# Patient Record
Sex: Male | Born: 1945 | Race: White | Hispanic: No | Marital: Married | Smoking: Never smoker
Health system: Southern US, Community
[De-identification: ages and names within clinical notes are randomized; demographics above are authoritative.]

## PROBLEM LIST (undated history)

## (undated) DIAGNOSIS — Q602 Renal agenesis, unspecified: Secondary | ICD-10-CM

## (undated) DIAGNOSIS — N411 Chronic prostatitis: Secondary | ICD-10-CM

## (undated) DIAGNOSIS — I10 Essential (primary) hypertension: Secondary | ICD-10-CM

## (undated) HISTORY — DX: Essential (primary) hypertension: I10

## (undated) HISTORY — DX: Renal agenesis, unspecified: Q60.2

## (undated) HISTORY — DX: Chronic prostatitis: N41.1

## (undated) HISTORY — PX: OTHER SURGICAL HISTORY: SHX169

---

## 2021-08-07 ENCOUNTER — Other Ambulatory Visit: Payer: Self-pay | Admitting: Family Medicine

## 2021-08-07 DIAGNOSIS — R202 Paresthesia of skin: Secondary | ICD-10-CM

## 2021-08-08 ENCOUNTER — Ambulatory Visit
Admission: RE | Admit: 2021-08-08 | Discharge: 2021-08-08 | Disposition: A | Discharge status: Home / Self Care | Payer: Medicare Other | Source: Ambulatory Visit | Attending: Family Medicine | Admitting: Family Medicine

## 2021-08-08 DIAGNOSIS — R202 Paresthesia of skin: Secondary | ICD-10-CM

## 2021-08-08 IMAGING — MR MR MRA HEAD W/O CM
1 series · 22 of 48 positions shown · IV contrast (multihance)
Comparison: None.

CLINICAL DATA: Left arm and left leg numbness.

EXAM:
MRI HEAD WITHOUT AND WITH CONTRAST
MRA HEAD WITHOUT CONTRAST
TECHNIQUE: Multiplanar, multiecho pulse sequences of the brain and surrounding
structures were obtained without and with intravenous contrast.
Angiographic images of the Circle of Willis were obtained using MRA
technique without intravenous contrast.
CONTRAST:  14 mL of MultiHance IV.

[Series 2: tof_3d_multi-slab · axial · 0.7mm · 0.35mm/px · z∈[-74,+21]mm · 22 of 143 slices shown]
[im 1/143]
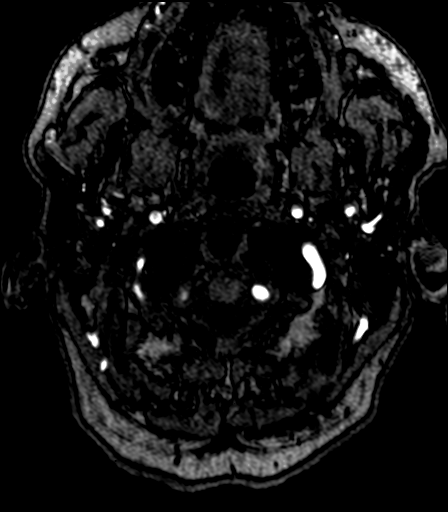
[im 4/143]
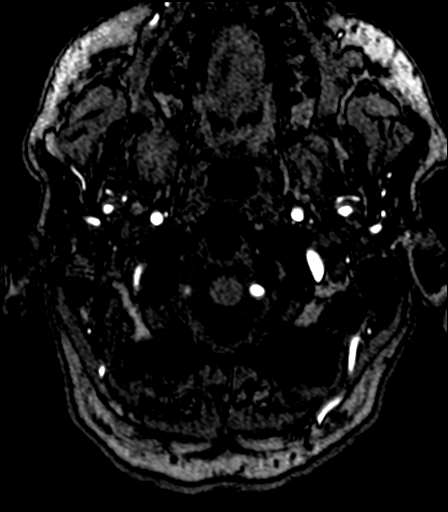
[im 7/143]
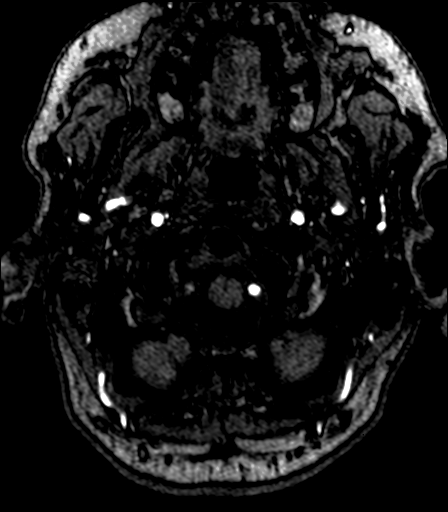
[im 10/143]
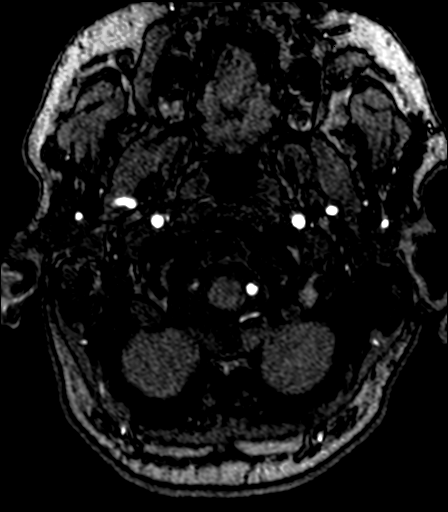
[im 13/143]
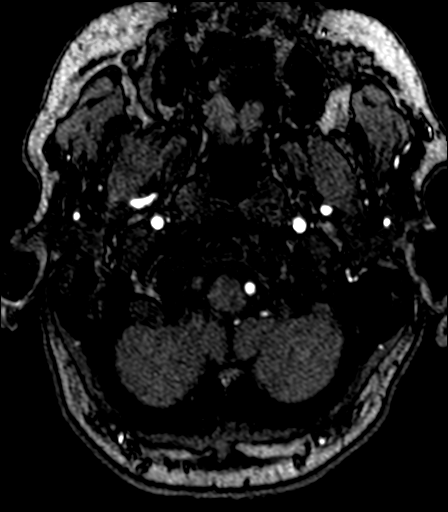
[im 16/143]
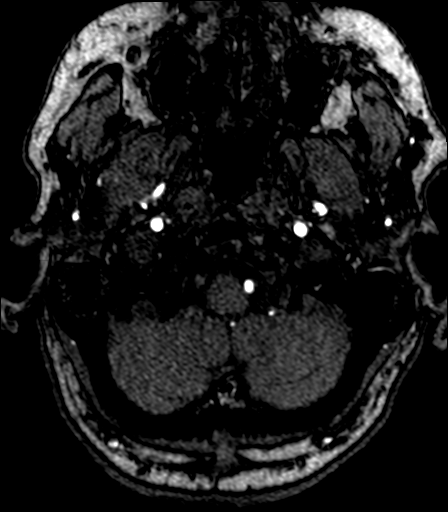
[im 19/143]
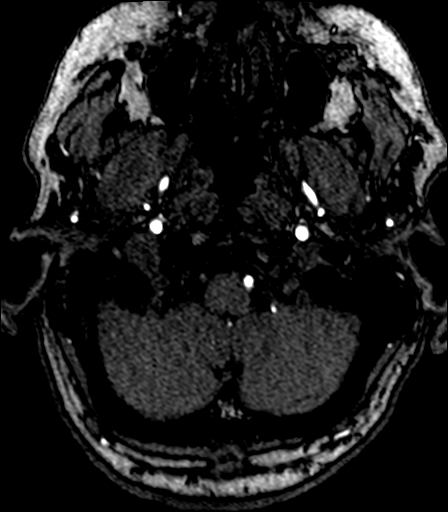
[im 22/143]
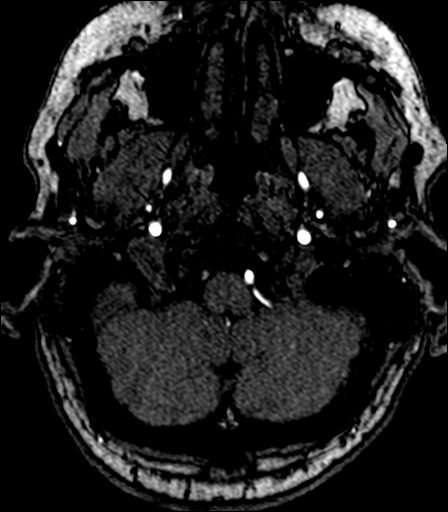
[im 25/143]
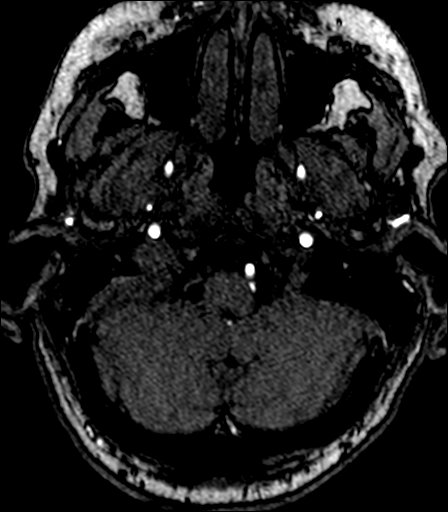
[im 28/143]
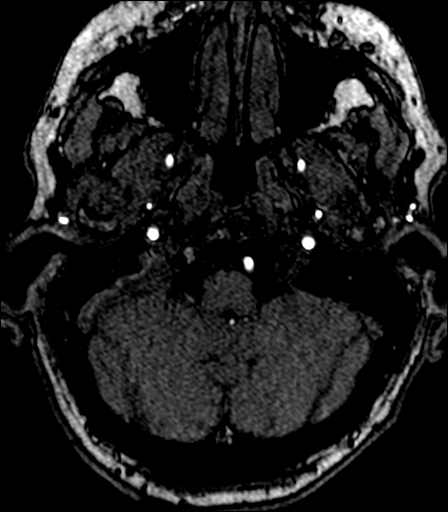
[im 31/143]
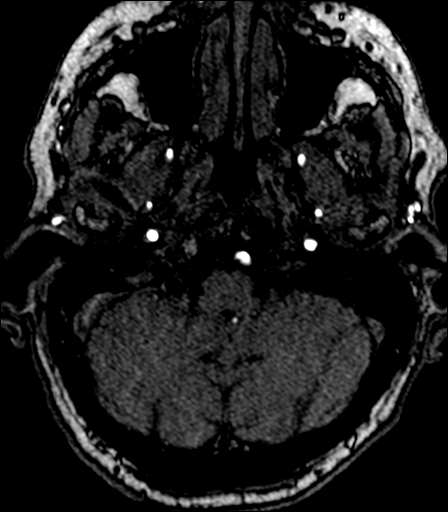
[im 34/143]
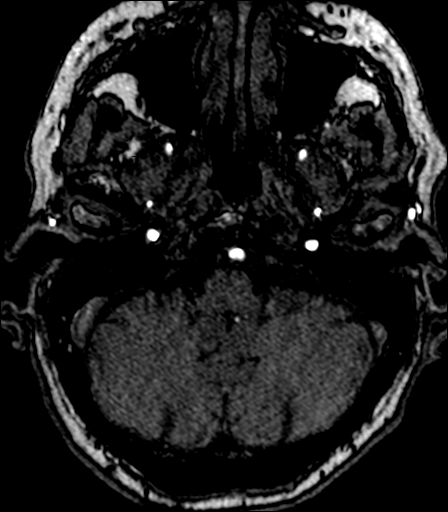
[im 37/143]
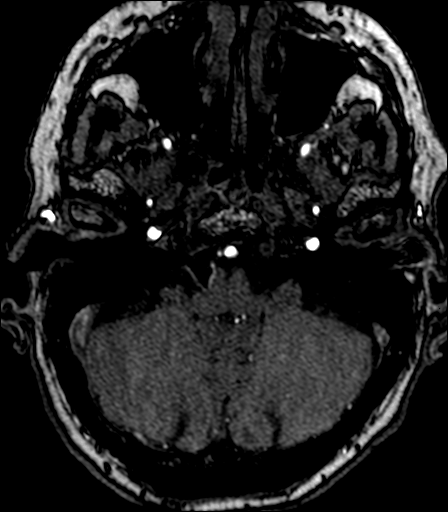
[im 40/143]
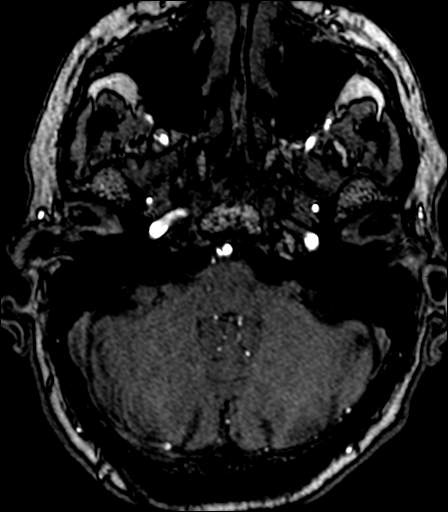
[im 46/143]
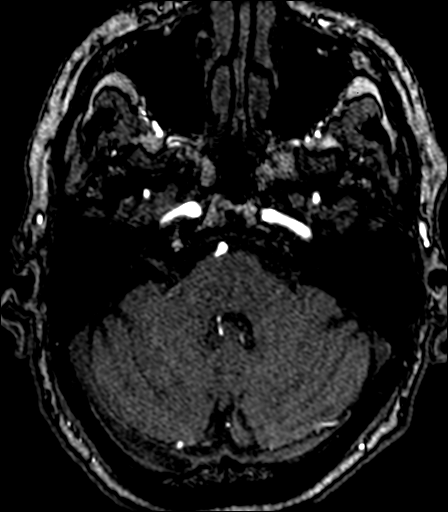
[im 64/143]
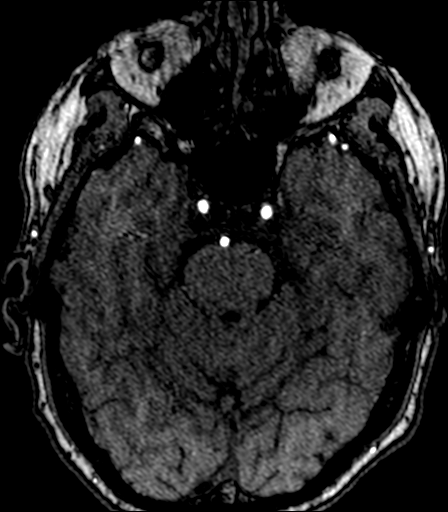
[im 73/143]
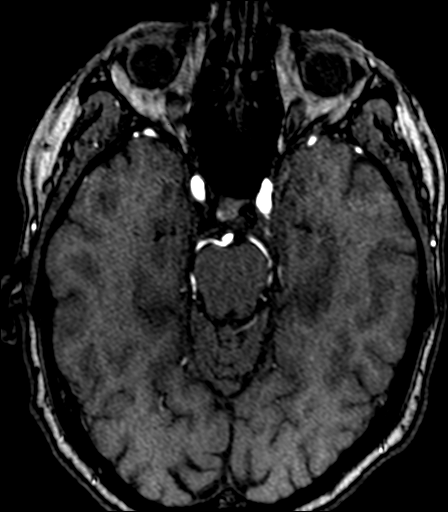
[im 82/143]
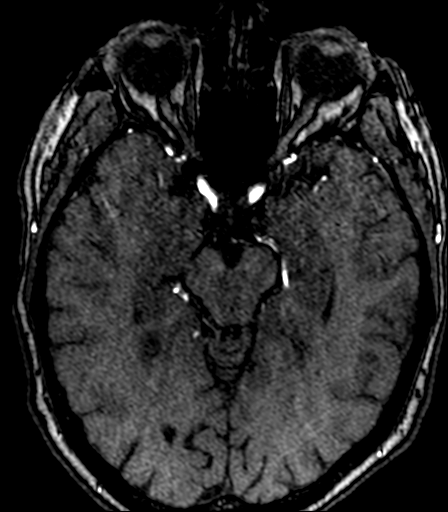
[im 100/143]
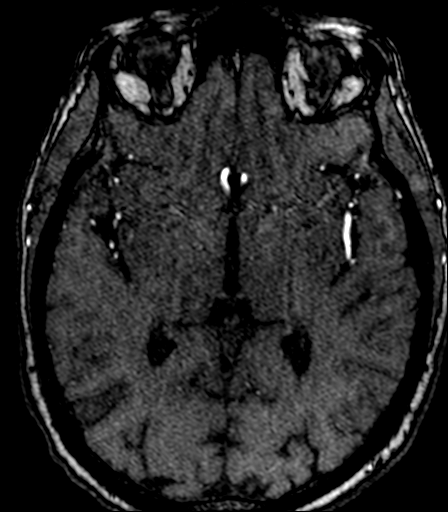
[im 118/143]
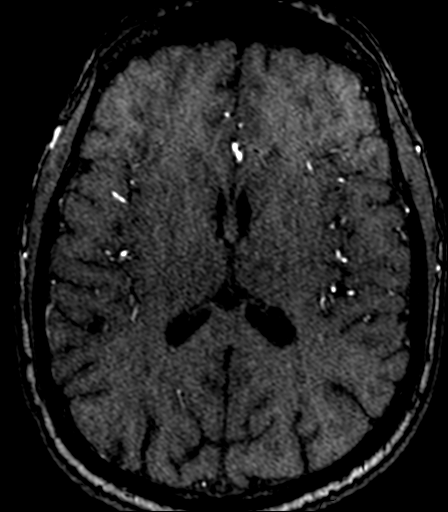
[im 121/143]
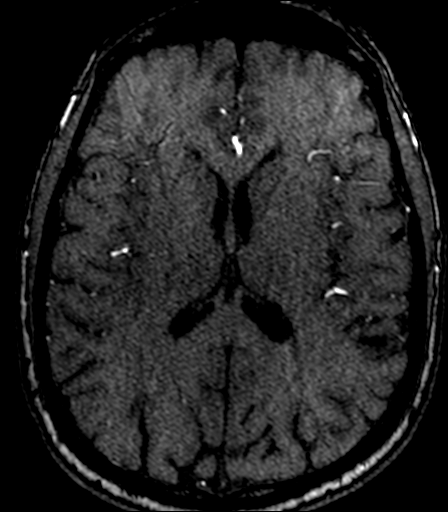
[im 136/143]
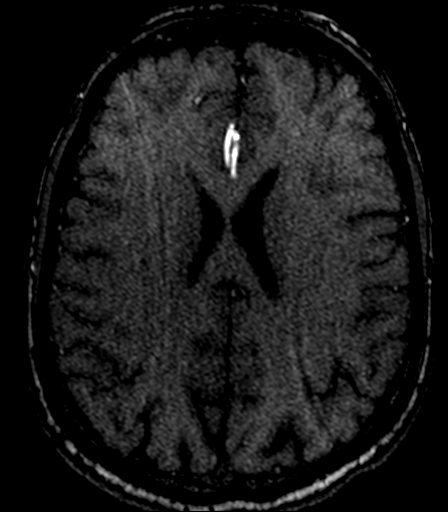

[22 of 48 positions shown; findings below may reference images not displayed]

FINDINGS: MRI HEAD FINDINGS

Brain: No acute infarction, hemorrhage, hydrocephalus, extra-axial
collection or mass lesion. Mild scattered T2/FLAIR hyperintensities
in the white matter, nonspecific but compatible with chronic
microvascular disease. No pathologic enhancement.

Vascular: See below.

Skull and upper cervical spine: Normal marrow signal.

Sinuses/Orbits: Mild paranasal sinus mucosal thickening. No acute
orbital findings.

Other: No mastoid effusions.

MRA HEAD FINDINGS

Anterior circulation: Bilateral intracranial ICAs, MCAs, and ACAs
are patent without proximal hemodynamically significant stenosis. No
aneurysm identified.

Posterior circulation: Suspected severe stenosis versus occlusion of
the the nondominant/small intradural right vertebral artery, which
demonstrates poor flow related signal. Left intradural vertebral
artery is patent without significant stenosis. Diminutive flow
related signal in the right PICA with more robust flow related
signal in the left PICA. Basilar artery and bilateral posterior
cerebral arteries are patent without proximal hemodynamically
significant stenosis. Mild right P2 PCA narrowing at a site of
tortuosity. No aneurysm identified.
IMPRESSION: MRI:

1. No evidence of acute intracranial abnormality.
2. Mild chronic microvascular ischemic disease.

MRA:

1. Suspected severe stenosis versus occlusion of the
nondominant/small intradural right vertebral artery, which
demonstrates poor flow related signal. Also, poor flow related
signal within the right PICA.
2. Otherwise, no large vessel occlusion or proximal hemodynamically
significant stenosis.

## 2021-08-08 IMAGING — MR MR HEAD WO/W CM
12 series · 48 of 48 positions shown · IV contrast (multihance)
Comparison: None.

CLINICAL DATA: Left arm and left leg numbness.

EXAM:
MRI HEAD WITHOUT AND WITH CONTRAST
MRA HEAD WITHOUT CONTRAST
TECHNIQUE: Multiplanar, multiecho pulse sequences of the brain and surrounding
structures were obtained without and with intravenous contrast.
Angiographic images of the Circle of Willis were obtained using MRA
technique without intravenous contrast.
CONTRAST:  14 mL of MultiHance IV.

[Series 2: T1 · sagittal · 5.0mm · 0.45mm/px · 2 of 23 slices shown]
[im 1/23]
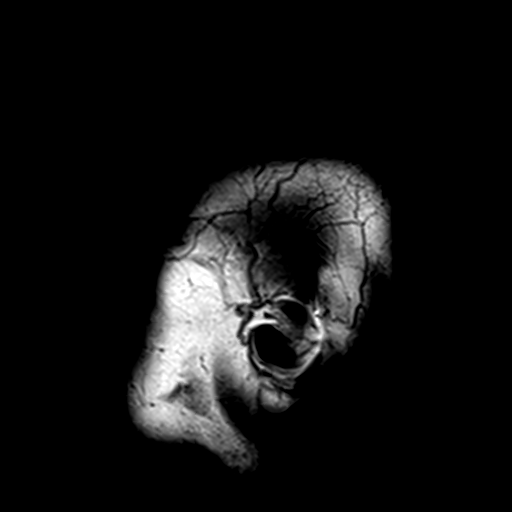
[im 23/23]
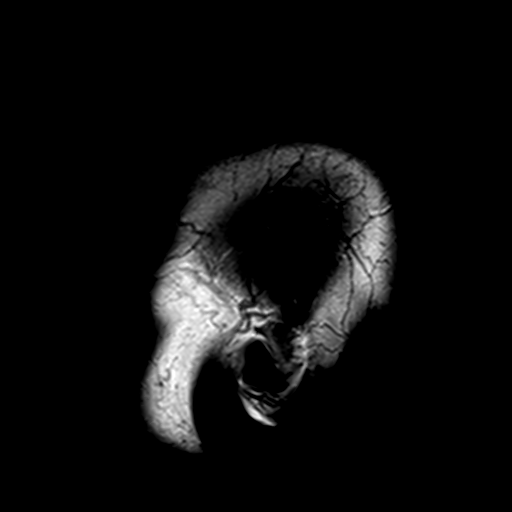

[Series 3: DWI · axial · 3.0mm · 1.80mm/px · z∈[-48,+97]mm · 6 of 100 slices shown (1 of 4)]
[im 1/100]
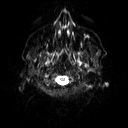
[im 20/100]
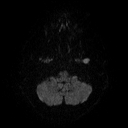
[im 40/100]
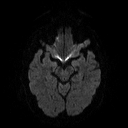
[im 60/100]
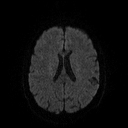
[im 80/100]
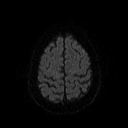
[im 100/100]
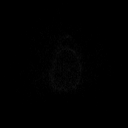

[Series 4: DWI · axial · 3.0mm · 1.80mm/px · z∈[-48,+97]mm · 3 of 50 slices shown (2 of 4)]
[im 1/50]
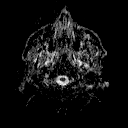
[im 25/50]
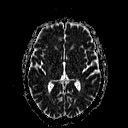
[im 50/50]
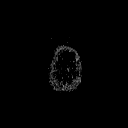

[Series 5: DWI · coronal · 5.0mm · 1.80mm/px · 5 of 72 slices shown (3 of 4)]
[im 1/72]
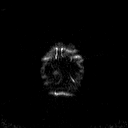
[im 18/72]
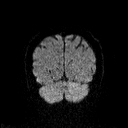
[im 36/72]
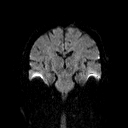
[im 54/72]
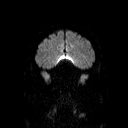
[im 72/72]
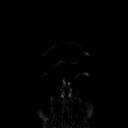

[Series 6: DWI · coronal · 5.0mm · 1.80mm/px · 2 of 36 slices shown (4 of 4)]
[im 1/36]
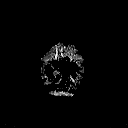
[im 36/36]
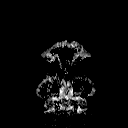

[Series 7: T2 · axial · 5.0mm · 0.60mm/px · 1 of 23 slices shown (1 of 2)]
[im 1/23]
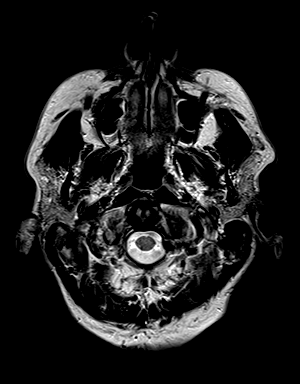

[Series 8: FLAIR · axial · 3.0mm · 0.45mm/px · z∈[-42,+91]mm · 2 of 30 slices shown]
[im 1/30]
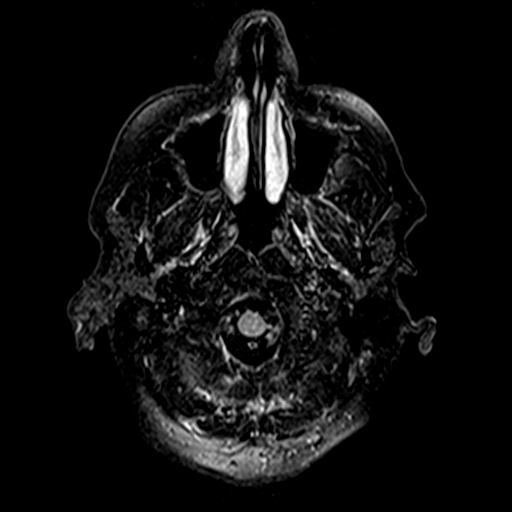
[im 30/30]
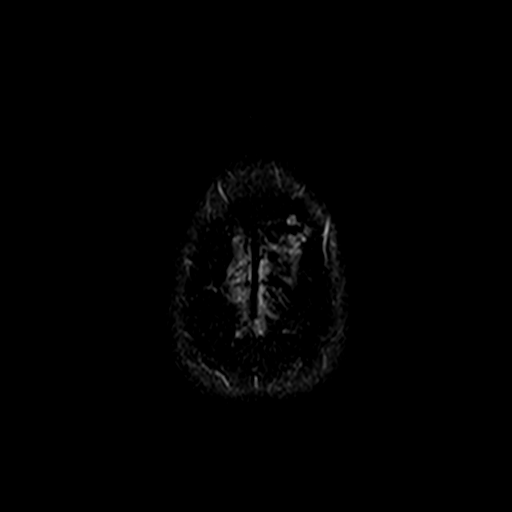

[Series 10: swi_images · axial · 4.0mm · 0.90mm/px · z∈[-52,+101]mm · 3 of 40 slices shown]
[im 1/40]
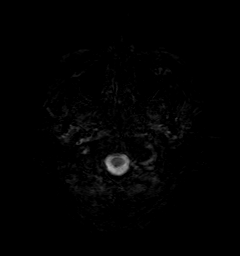
[im 20/40]
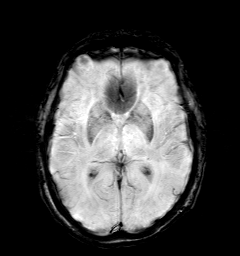
[im 40/40]
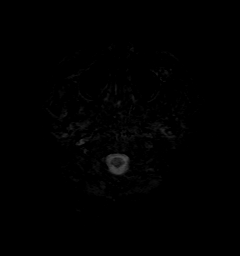

[Series 11: t1_mpr_tra · axial · 1.0mm · 0.75mm/px · z∈[-52,+104]mm · 10 of 160 slices shown]
[im 1/160]
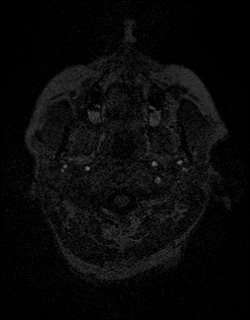
[im 18/160]
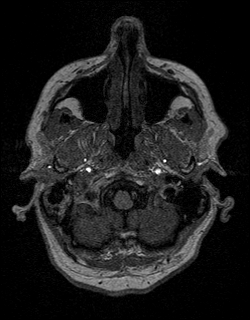
[im 36/160]
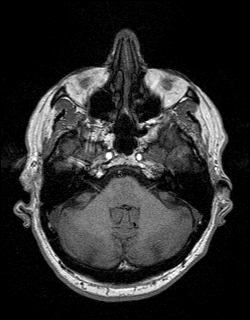
[im 54/160]
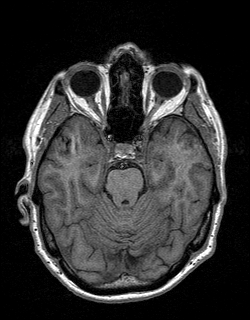
[im 71/160]
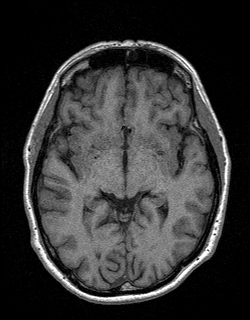
[im 89/160]
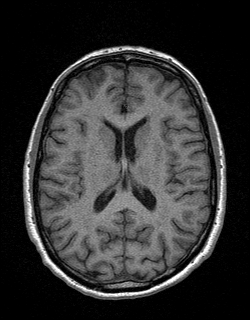
[im 107/160]
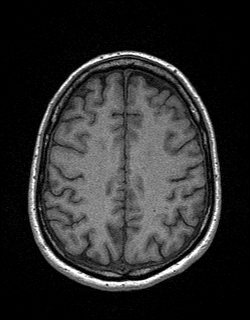
[im 124/160]
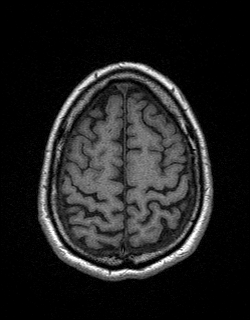
[im 142/160]
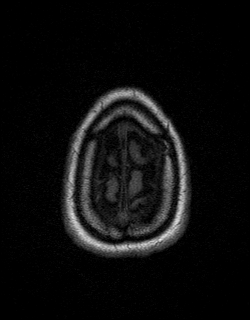
[im 160/160]
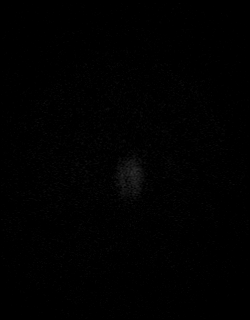

[Series 12: T2 · coronal · 5.0mm · 0.45mm/px · 2 of 28 slices shown (2 of 2)]
[im 1/28]
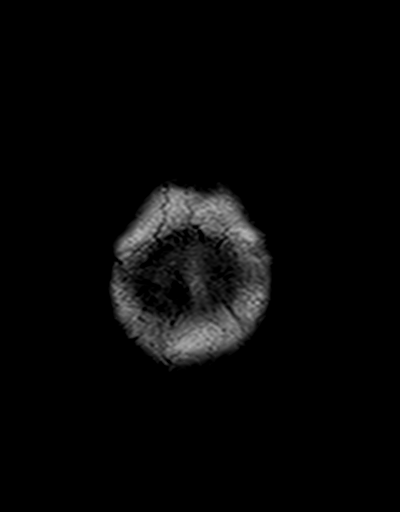
[im 28/28]
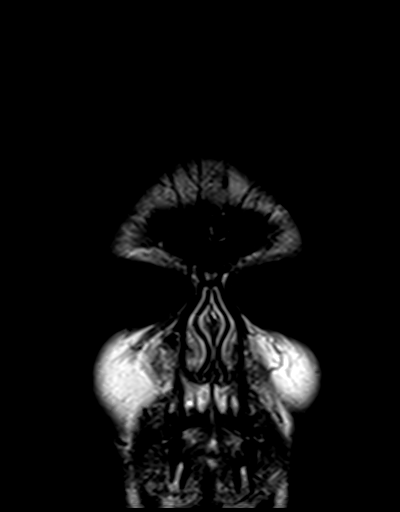

[Series 13: t1_mpr_tra post · axial · 1.0mm · 0.75mm/px · z∈[-52,+104]mm · 10 of 160 slices shown]
[im 1/160]
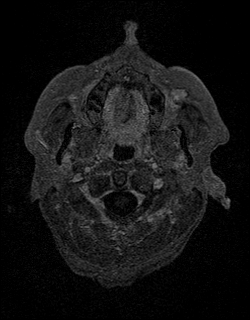
[im 18/160]
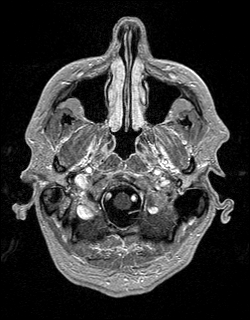
[im 36/160]
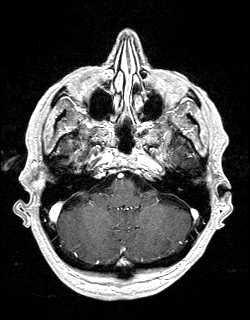
[im 54/160]
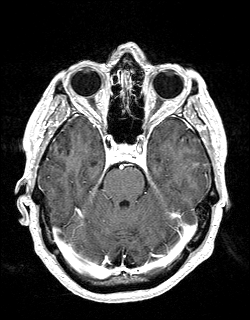
[im 71/160]
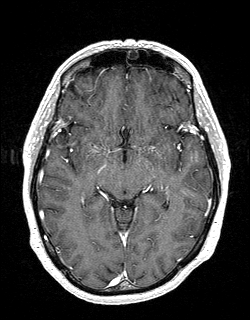
[im 89/160]
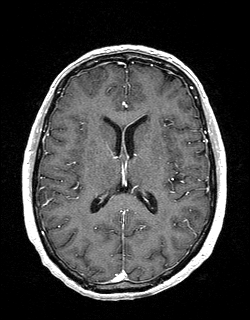
[im 107/160]
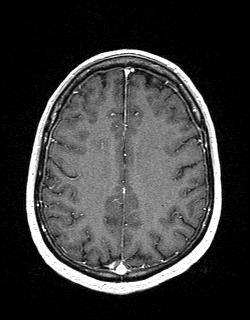
[im 124/160]
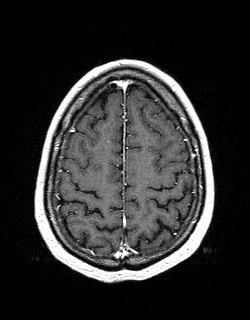
[im 142/160]
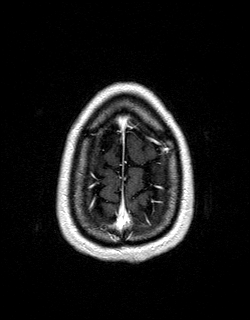
[im 160/160]
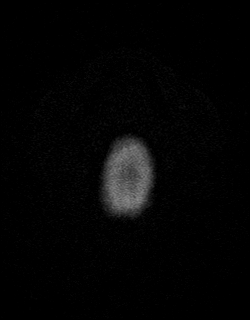

[Series 14: post cor · coronal · 5.0mm · 0.45mm/px · 2 of 28 slices shown]
[im 1/28]
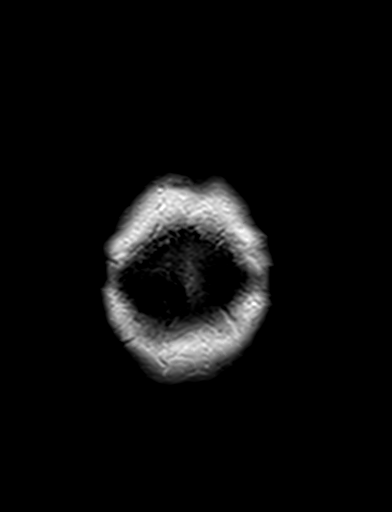
[im 28/28]
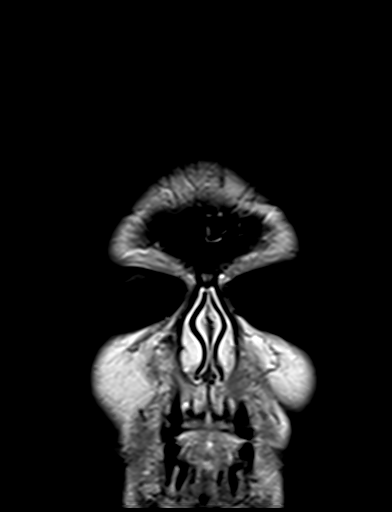

[48 of 48 positions shown; findings below may reference images not displayed]

FINDINGS: MRI HEAD FINDINGS

Brain: No acute infarction, hemorrhage, hydrocephalus, extra-axial
collection or mass lesion. Mild scattered T2/FLAIR hyperintensities
in the white matter, nonspecific but compatible with chronic
microvascular disease. No pathologic enhancement.

Vascular: See below.

Skull and upper cervical spine: Normal marrow signal.

Sinuses/Orbits: Mild paranasal sinus mucosal thickening. No acute
orbital findings.

Other: No mastoid effusions.

MRA HEAD FINDINGS

Anterior circulation: Bilateral intracranial ICAs, MCAs, and ACAs
are patent without proximal hemodynamically significant stenosis. No
aneurysm identified.

Posterior circulation: Suspected severe stenosis versus occlusion of
the the nondominant/small intradural right vertebral artery, which
demonstrates poor flow related signal. Left intradural vertebral
artery is patent without significant stenosis. Diminutive flow
related signal in the right PICA with more robust flow related
signal in the left PICA. Basilar artery and bilateral posterior
cerebral arteries are patent without proximal hemodynamically
significant stenosis. Mild right P2 PCA narrowing at a site of
tortuosity. No aneurysm identified.
IMPRESSION: MRI:

1. No evidence of acute intracranial abnormality.
2. Mild chronic microvascular ischemic disease.

MRA:

1. Suspected severe stenosis versus occlusion of the
nondominant/small intradural right vertebral artery, which
demonstrates poor flow related signal. Also, poor flow related
signal within the right PICA.
2. Otherwise, no large vessel occlusion or proximal hemodynamically
significant stenosis.

## 2021-08-08 MED ORDER — GADOBENATE DIMEGLUMINE 529 MG/ML IV SOLN
14.0000 mL | Freq: Once | INTRAVENOUS | Status: AC | PRN
  Administered 2021-08-08: 14 mL via INTRAVENOUS

## 2021-09-18 ENCOUNTER — Encounter: Payer: Self-pay | Admitting: Neurology

## 2021-09-18 ENCOUNTER — Ambulatory Visit (INDEPENDENT_AMBULATORY_CARE_PROVIDER_SITE_OTHER): Payer: Medicare Other | Admitting: Neurology

## 2021-09-18 VITALS — BP 180/88 | HR 84 | Ht 70.5 in | Wt 157.0 lb

## 2021-09-18 DIAGNOSIS — R42 Dizziness and giddiness: Secondary | ICD-10-CM

## 2021-09-18 DIAGNOSIS — R202 Paresthesia of skin: Secondary | ICD-10-CM
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
## 2021-09-18 NOTE — Patient Instructions (Signed)
Recommend CTA H&N, MRA often overcalls blood vessel stenosis and should check carotids ?Recommend lipid panel with ldl < 70 starting lipitor or other for tight control of ldl especially in light of a possible occluded vertebral artery. ?Continue asa 81mg  every day for life. ? ?Stroke Prevention ?Some medical conditions and behaviors can lead to a higher chance of having a stroke. You can help prevent a stroke by eating healthy, exercising, not smoking, and managing any medical conditions you have. ?Stroke is a leading cause of functional impairment. Primary prevention is particularly important because a majority of strokes are first-time events. Stroke changes the lives of not only those who experience a stroke but also their family and other caregivers. ?How can this condition affect me? ?A stroke is a medical emergency and should be treated right away. A stroke can lead to brain damage and can sometimes be life-threatening. If a person gets medical treatment right away, there is a better chance of surviving and recovering from a stroke. ?What can increase my risk? ?The following medical conditions may increase your risk of a stroke: ?Cardiovascular disease. ?High blood pressure (hypertension). ?Diabetes. ?High cholesterol. ?Sickle cell disease. ?Blood clotting disorders (hypercoagulable state). ?Obesity. ?Sleep disorders (obstructive sleep apnea). ?Other risk factors include: ?Being older than age 35. ?Having a history of blood clots, stroke, or mini-stroke (transient ischemic attack, TIA). ?Genetic factors, such as race, ethnicity, or a family history of stroke. ?Smoking cigarettes or using other tobacco products. ?Taking birth control pills, especially if you also use tobacco. ?Heavy use of alcohol or drugs, especially cocaine and methamphetamine. ?Physical inactivity. ?What actions can I take to prevent this? ?Manage your health conditions ?High cholesterol levels. ?Eating a healthy diet is important for  preventing high cholesterol. If cholesterol cannot be managed through diet alone, you may need to take medicines. ?Take any prescribed medicines to control your cholesterol as told by your health care provider. ?Hypertension. ?To reduce your risk of stroke, try to keep your blood pressure below 130/80. ?Eating a healthy diet and exercising regularly are important for controlling blood pressure. If these steps are not enough to manage your blood pressure, you may need to take medicines. ?Take any prescribed medicines to control hypertension as told by your health care provider. ?Ask your health care provider if you should monitor your blood pressure at home. ?Have your blood pressure checked every year, even if your blood pressure is normal. Blood pressure increases with age and some medical conditions. ?Diabetes. ?Eating a healthy diet and exercising regularly are important parts of managing your blood sugar (glucose). If your blood sugar cannot be managed through diet and exercise, you may need to take medicines. ?Take any prescribed medicines to control your diabetes as told by your health care provider. ?Get evaluated for obstructive sleep apnea. Talk to your health care provider about getting a sleep evaluation if you snore a lot or have excessive sleepiness. ?Make sure that any other medical conditions you have, such as atrial fibrillation or atherosclerosis, are managed. ?Nutrition ?Follow instructions from your health care provider about what to eat or drink to help manage your health condition. These instructions may include: ?Reducing your daily calorie intake. ?Limiting how much salt (sodium) you use to 1,500 milligrams (mg) each day. ?Using only healthy fats for cooking, such as olive oil, canola oil, or sunflower oil. ?Eating healthy foods. You can do this by: ?Choosing foods that are high in fiber, such as whole grains, and fresh fruits and vegetables. ?Eating at  least 5 servings of fruits and  vegetables a day. Try to fill one-half of your plate with fruits and vegetables at each meal. ?Choosing lean protein foods, such as lean cuts of meat, poultry without skin, fish, tofu, beans, and nuts. ?Eating low-fat dairy products. ?Avoiding foods that are high in sodium. This can help lower blood pressure. ?Avoiding foods that have saturated fat, trans fat, and cholesterol. This can help prevent high cholesterol. ?Avoiding processed and prepared foods. ?Counting your daily carbohydrate intake. ? ?Lifestyle ?If you drink alcohol: ?Limit how much you have to: ?0-1 drink a day for women who are not pregnant. ?0-2 drinks a day for men. ?Know how much alcohol is in your drink. In the [place] one drink equals one 12 oz bottle of beer ( ), one 5 oz glass of wine ( ), or one 1? oz glass of hard liquor (77mL). ?Do not use any products that contain nicotine or tobacco. These products include cigarettes, chewing tobacco, and vaping devices, such as e-cigarettes. If you need help quitting, ask your health care provider. ?Avoid secondhand smoke. ?Do not use drugs. ?Activity ? ?Try to stay at a healthy weight. ?Get at least 30 minutes of exercise on most days, such as: ?Fast walking. ?Biking. ?Swimming. ?Medicines ?Take over-the-counter and prescription medicines only as told by your health care provider. Aspirin or blood thinners (antiplatelets or anticoagulants) may be recommended to reduce your risk of forming blood clots that can lead to stroke. ?Avoid taking birth control pills. Talk to your health care provider about the risks of taking birth control pills if: ?You are over 38 years old. ?You smoke. ?You get very bad headaches. ?You have had a blood clot. ?Where to find more information ?American Stroke Association: www.strokeassociation.org ?Get help right away if: ?You or a loved one has any symptoms of a stroke. "BE FAST" is an easy way to remember the main warning signs of a stroke: ?B - Balance. Signs are  dizziness, sudden trouble walking, or loss of balance. ?E - Eyes. Signs are trouble seeing or a sudden change in vision. ?F - Face. Signs are sudden weakness or numbness of the face, or the face or eyelid drooping on one side. ?A - Arms. Signs are weakness or numbness in an arm. This happens suddenly and usually on one side of the body. ?S - Speech. Signs are sudden trouble speaking, slurred speech, or trouble understanding what people say. ?T - Time. Time to call emergency services. Write down what time symptoms started. ?You or a loved one has other signs of a stroke, such as: ?A sudden, severe headache with no known cause. ?Nausea or vomiting. ?Seizure. ?These symptoms may represent a serious problem that is an emergency. Do not wait to see if the symptoms will go away. Get medical help right away. Call your local emergency services (911 in the [place] Do not drive yourself to the hospital. ?Summary ?You can help to prevent a stroke by eating healthy, exercising, not smoking, limiting alcohol intake, and managing any medical conditions you may have. ?Do not use any products that contain nicotine or tobacco. These include cigarettes, chewing tobacco, and vaping devices, such as e-cigarettes. If you need help quitting, ask your health care provider. ?Remember "BE FAST" for warning signs of a stroke. Get help right away if you or a loved one has any of these signs. ?This information is not intended to replace advice given to you by your health care provider. Make sure you discuss any questions  you have with your health care provider. ?Document Revised: 11/28/2019 Document Reviewed: 11/28/2019 ?Elsevier Patient Education ? 2023 [employer] ? ?

## 2021-11-18 ENCOUNTER — Other Ambulatory Visit (HOSPITAL_COMMUNITY): Payer: Self-pay | Admitting: Family Medicine

## 2021-11-18 ENCOUNTER — Ambulatory Visit (HOSPITAL_COMMUNITY)
Admission: RE | Admit: 2021-11-18 | Discharge: 2021-11-18 | Disposition: A | Discharge status: Home / Self Care | Payer: Medicare Other | Source: Ambulatory Visit | Attending: Family Medicine | Admitting: Family Medicine

## 2021-11-18 DIAGNOSIS — R0989 Other specified symptoms and signs involving the circulatory and respiratory systems: Secondary | ICD-10-CM | POA: Diagnosis present

## 2022-01-20 ENCOUNTER — Other Ambulatory Visit: Payer: Self-pay | Admitting: Family Medicine

## 2022-01-20 ENCOUNTER — Other Ambulatory Visit (HOSPITAL_BASED_OUTPATIENT_CLINIC_OR_DEPARTMENT_OTHER): Payer: Self-pay | Admitting: Family Medicine

## 2022-01-20 DIAGNOSIS — R202 Paresthesia of skin: Secondary | ICD-10-CM

## 2022-01-20 DIAGNOSIS — R911 Solitary pulmonary nodule: Secondary | ICD-10-CM

## 2022-01-20 DIAGNOSIS — E782 Mixed hyperlipidemia: Secondary | ICD-10-CM

## 2022-01-27 ENCOUNTER — Other Ambulatory Visit (HOSPITAL_BASED_OUTPATIENT_CLINIC_OR_DEPARTMENT_OTHER): Payer: Medicare Other

## 2022-02-06 ENCOUNTER — Ambulatory Visit
Admission: RE | Admit: 2022-02-06 | Discharge: 2022-02-06 | Disposition: A | Discharge status: Home / Self Care | Payer: Medicare Other | Source: Ambulatory Visit | Attending: Family Medicine | Admitting: Family Medicine

## 2022-02-06 DIAGNOSIS — R202 Paresthesia of skin: Secondary | ICD-10-CM

## 2022-02-06 MED ORDER — IOPAMIDOL (ISOVUE-370) INJECTION 76%
75.0000 mL | Freq: Once | INTRAVENOUS | Status: AC | PRN
  Administered 2022-02-06: 75 mL via INTRAVENOUS

## 2022-02-13 ENCOUNTER — Telehealth: Payer: Self-pay | Admitting: Neurology

## 2022-02-27 ENCOUNTER — Ambulatory Visit (HOSPITAL_BASED_OUTPATIENT_CLINIC_OR_DEPARTMENT_OTHER)
Admission: RE | Admit: 2022-02-27 | Discharge: 2022-02-27 | Disposition: A | Discharge status: Home / Self Care | Payer: Medicare Other | Source: Ambulatory Visit | Attending: Family Medicine | Admitting: Family Medicine

## 2022-02-27 DIAGNOSIS — E782 Mixed hyperlipidemia: Secondary | ICD-10-CM | POA: Insufficient documentation

## 2022-02-27 DIAGNOSIS — R911 Solitary pulmonary nodule: Secondary | ICD-10-CM | POA: Insufficient documentation

## 2022-03-18 ENCOUNTER — Encounter: Payer: Self-pay | Admitting: Neurology

## 2022-03-18 ENCOUNTER — Ambulatory Visit (INDEPENDENT_AMBULATORY_CARE_PROVIDER_SITE_OTHER): Payer: Medicare Other | Admitting: Neurology

## 2022-03-18 VITALS — BP 160/86 | HR 75 | Ht 70.0 in | Wt 156.6 lb

## 2022-03-18 DIAGNOSIS — G459 Transient cerebral ischemic attack, unspecified: Secondary | ICD-10-CM | POA: Diagnosis not present

## 2022-03-18 DIAGNOSIS — I6521 Occlusion and stenosis of right carotid artery: Secondary | ICD-10-CM | POA: Diagnosis not present

## 2022-03-18 NOTE — Patient Instructions (Addendum)
Updated: This is a 76 year old patient who came to see me for left-sided paresthesias and many years of transient neurologic deficits.  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    CT scoring, which I did not order, showed a pulmonary nodule recommendations were to follow-up.  Carotid Dopplers, ordered by Dr. Duaine DredgeBlomgren, were unremarkable in July 2023.MRi of the brain showed nothing acute.  Nothing at this point to explain his episodic left-sided hemisensory deficits in the face arm and leg BUT a 60% blocked symptomatic carotid artery would be candidate for intervention or medical management.  .  I would also recommend a 30-day cardiac monitor followed by a loop recorder if were to consider these TIAs he may have paroxysmal A-fib nobody is ever caught.(He orefers to hold off) Given his V4 stenosis and 60% stenosis on the right I would like to send to [facility] to evaluate for collaterals or posibility for embolic phenomenon from these area.   - Dr. Corliss Skainseveshwar for cerebral angiogram possible 60% stenosis of left carotid artery cerebral angiogram: - Need cerebral angigram especially for the right 60% blocked carotid artery (possibly) symptomatic for his left-sided TIA.Called Dr. Duaine DredgeBlomgren to get his opinion and awaiting   Cerebral Angiogram  A cerebral angiogram is a procedure that is used to examine the blood vessels in the brain and neck. Contrast dye is injected through a thin tube (catheter) into an artery. X-ray pictures are then taken. The pictures can show an abnormality in a blood vessel, such as a blockage, narrowing (stenosis), or bulging (aneurysm). Tell a health care provider about: Any allergies you have, including allergies to medicines, shellfish, contrast dye, or iodine. All medicines you are taking, including vitamins, herbs, eye drops, creams, and over-the-counter medicines. Any blood  thinning medicines you take, such as aspirin or warfarin. Any problems you or family members have had with anesthetic medicines. Any blood disorders you have. Any surgeries you have had. Any medical conditions you have or have had, including kidney problems or kidney failure. Whether you are pregnant or may be pregnant, or are breastfeeding. What are the risks? Generally, this is a safe procedure. However, problems may occur, including: Problems in the insertion site, such as bleeding, bruising, infection, or collection of blood under the skin (hematoma). Allergic reaction to medicines or dyes. Damage to nearby structures or organs, including blood vessels or arteries. Also, contrast dye can damage the kidneys. Blood clot. Weakness, numbness, speech, or vision problems. This is usually temporary. Stroke. Amnesia, or being unable to remember what happened (rare). This is temporary. What happens before the procedure? Staying hydrated Follow instructions from your healthcare provider about hydration, which may include: Up to 2 hours before the procedure - you may continue to drink clear liquids, such as water, clear fruit juice, black coffee, and plain tea. Eating and drinking restrictions Follow instructions from your health care provider about eating and drinking, which may include: 8 hours before the procedure - stop eating heavy meals or foods, such as meat, fried foods, or fatty foods. 6 hours before the procedure - stop eating light meals or foods, such as toast or cereal. 6 hours before the procedure - stop drinking milk or drinks that contain milk. 2 hours before the procedure - stop drinking clear liquids. Medicines Ask your health care provider about: Changing or stopping your regular medicines. This is especially important if you are taking diabetes medicines or blood  thinners. Taking medicines such as aspirin and ibuprofen. These medicines can thin your blood. Do not take these  medicines unless your health care provider tells you to take them. Taking over-the-counter medicines, vitamins, herbs, and supplements. General instructions Do not use any products that contain nicotine or tobacco for at least 4 weeks before the procedure. These products include cigarettes, e-cigarettes, and chewing tobacco. If you need help quitting, ask your health care provider. You may have blood tests done. Plan to have someone take you home from the hospital or clinic. If you will be going home the same day of the procedure, plan to have someone with you for 24 hours. Ask your health care provider what steps will be taken to prevent infection. These may include: Removing hair at the insertion site. Washing skin with a germ-killing soap. Taking antibiotic medicine. What happens during the procedure? You will lie on an X-ray table. Your head and legs may be strapped down. An IV will be inserted into one of your veins. You will be given one or both of the following: A medicine to help you relax (sedative). A medicine to numb the area (local anesthetic) where the catheter will be inserted, usually in your groin, leg, or arm. Your heart rate and other vital signs will be watched carefully. Electrodes may be placed on your chest. A small incision will be made. The catheter will be moved through the incision up to the blood vessels in your neck and brain. Dye will be injected into the catheter and will travel to the blood vessels of the brain and neck. You may notice a warm feeling or strange taste in your mouth. You will be asked to lie still. X-rays will be taken to show the flow of the dye through the blood vessels in the brain and neck. If an abnormality is found in a blood vessel, another procedure may be done to treat the problem. Tell your health care provider if you develop chest pain or trouble breathing during the procedure. When the images are finished, the catheter will be removed.  Pressure will be applied to stop bleeding. A closure device may be placed into the access site to form a seal. The seal stops bleeding and helps the artery heal. A bandage (dressing)will be applied over the small opening in the skin. Your IV will be removed. The procedure may vary among health care providers and hospitals. What happens after the procedure? Your blood pressure, heart rate, breathing rate, and blood oxygen level will be monitored until you leave the hospital or clinic. You will be asked to lie flat for several hours. You will keep the limb where the catheter was inserted straight. The insertion site and the pulse in your foot or wrist will be checked often. You will be told to drink plenty of fluids. This will help flush the contrast dye out of your system. Do not drive for 24 hours if you were given a sedative during your procedure. It is up to you to get the results of your procedure. Ask your health care provider, or the department that is doing the procedure, when your results will be ready. Summary A cerebral angiogram is a procedure that checks the health of the blood vessels in the brain and neck. You will be given a sedativeand a local anesthetic. You may feel pressure when the catheter is inserted and warmth when the dye is injected. Contrast dye is injected through a catheter into an artery. X-rays are  taken to look for an abnormality, such as blockage or narrowing. After the procedure, you will be asked to lie flat for several hours. Do not drive for 24 hours, or until a health care provider tells you to. This information is not intended to replace advice given to you by your health care provider. Make sure you discuss any questions you have with your health care provider. Document Revised: 11/16/2018 Document Reviewed: 11/16/2018 Elsevier Patient Education  2023 [employer]  Dr. Estanislado Pandy Cerebral Angiogram to confirm 60% blocked internal right carotid artery which  could be symptomatic for his left-sided hemisensory/ TIA 30-day hear monitor for afib and the a loop recorder  Atrial Fibrillation  Atrial fibrillation is a type of irregular or rapid heartbeat (arrhythmia). In atrial fibrillation, the top part of the heart (atria) beats in an irregular pattern. This makes the heart unable to pump blood normally and effectively. The goal of treatment is to prevent blood clots from forming, control your heart rate, or restore your heartbeat to a normal rhythm. If this condition is not treated, it can cause serious problems, such as a weakened heart muscle (cardiomyopathy) or a stroke. What are the causes? This condition is often caused by medical conditions that damage the heart's electrical system. These include: High blood pressure (hypertension). This is the most common cause. Certain heart problems or conditions, such as heart failure, coronary artery disease, heart valve problems, or heart surgery. Diabetes. Overactive thyroid (hyperthyroidism). Obesity. Chronic kidney disease. In some cases, the cause of this condition is not known. What increases the risk? This condition is more likely to develop in: Older people. People who smoke. Athletes who do endurance exercise. People who have a family history of atrial fibrillation. Men. People who use drugs. People who drink a lot of alcohol. People who have lung conditions, such as emphysema, pneumonia, or COPD. People who have obstructive sleep apnea. What are the signs or symptoms? Symptoms of this condition include: A feeling that your heart is racing or beating irregularly. Discomfort or pain in your chest. Shortness of breath. Sudden light-headedness or weakness. Tiring easily during exercise or activity. Fatigue. Syncope (fainting). Sweating. In some cases, there are no symptoms. How is this diagnosed? Your health care provider may detect atrial fibrillation when taking your pulse. If  detected, this condition may be diagnosed with: An electrocardiogram (ECG) to check electrical signals of the heart. An ambulatory cardiac monitor to record your heart's activity for a few days. A transthoracic echocardiogram (TTE) to create pictures of your heart. A transesophageal echocardiogram (TEE) to create even closer pictures of your heart. A stress test to check your blood supply while you exercise. Imaging tests, such as a CT scan or chest X-ray. Blood tests. How is this treated? Treatment depends on underlying conditions and how you feel when you experience atrial fibrillation. This condition may be treated with: Medicines to prevent blood clots or to treat heart rate or heart rhythm problems. Electrical cardioversion to reset the heart's rhythm. A pacemaker to correct abnormal heart rhythm. Ablation to remove the heart tissue that sends abnormal signals. Left atrial appendage closure to seal the area where blood clots can form. In some cases, underlying conditions will be treated. Follow these instructions at home: Medicines Take over-the counter and prescription medicines only as told by your health care provider. Do not take any new medicines without talking to your health care provider. If you are taking blood thinners: Talk with your health care provider before you  take any medicines that contain aspirin or NSAIDs, such as ibuprofen. These medicines increase your risk for dangerous bleeding. Take your medicine exactly as told, at the same time every day. Avoid activities that could cause injury or bruising, and follow instructions about how to prevent falls. Wear a medical alert bracelet or carry a card that lists what medicines you take. Lifestyle     Do not use any products that contain nicotine or tobacco, such as cigarettes, e-cigarettes, and chewing tobacco. If you need help quitting, ask your health care provider. Eat heart-healthy foods. Talk with a dietitian to  make an eating plan that is right for you. Exercise regularly as told by your health care provider. Do not drink alcohol. Lose weight if you are overweight. Do not use drugs, including cannabis. General instructions If you have obstructive sleep apnea, manage your condition as told by your health care provider. Do not use diet pills unless your health care provider approves. Diet pills can make heart problems worse. Keep all follow-up visits as told by your health care provider. This is important. Contact a health care provider if you: Notice a change in the rate, rhythm, or strength of your heartbeat. Are taking a blood thinner and you notice more bruising. Tire more easily when you exercise or do heavy work. Have a sudden change in weight. Get help right away if you have:  Chest pain, abdominal pain, sweating, or weakness. Trouble breathing. Side effects of blood thinners, such as blood in your vomit, stool, or urine, or bleeding that cannot stop. Any symptoms of a stroke. "BE FAST" is an easy way to remember the main warning signs of a stroke: B - Balance. Signs are dizziness, sudden trouble walking, or loss of balance. E - Eyes. Signs are trouble seeing or a sudden change in vision. F - Face. Signs are sudden weakness or numbness of the face, or the face or eyelid drooping on one side. A - Arms. Signs are weakness or numbness in an arm. This happens suddenly and usually on one side of the body. S - Speech. Signs are sudden trouble speaking, slurred speech, or trouble understanding what people say. T - Time. Time to call emergency services. Write down what time symptoms started. Other signs of a stroke, such as: A sudden, severe headache with no known cause. Nausea or vomiting. Seizure. These symptoms may represent a serious problem that is an emergency. Do not wait to see if the symptoms will go away. Get medical help right away. Call your local emergency services (911 in the [place]  Do not drive yourself to the hospital. Summary Atrial fibrillation is a type of irregular or rapid heartbeat (arrhythmia). Symptoms include a feeling that your heart is beating fast or irregularly. You may be given medicines to prevent blood clots or to treat heart rate or heart rhythm problems. Get help right away if you have signs or symptoms of a stroke. Get help right away if you cannot catch your breath or have chest pain or pressure. This information is not intended to replace advice given to you by your health care provider. Make sure you discuss any questions you have with your health care provider.  Loop recorder  Implantable Loop Recorder Placement  An implantable loop recorder is a small electronic device that is placed under the skin of the chest. The device records the electrical activity of the heart over a long period of time. Your health care provider can download these recordings  to monitor your heart. You may need an implantable loop recorder if you have periods of abnormal heart activity (arrhythmias) or unexplained fainting (syncope). The recorder can be left in place for as long as 3 years. Tell a health care provider about: Any allergies you have. All medicines you are taking, including vitamins, herbs, eye drops, creams, and over-the-counter medicines. Any problems you or family members have had with anesthesia. Any bleeding problems you have. Any surgeries you have had. Any medical conditions you have. Whether you are pregnant or may be pregnant. What are the risks? Your health care provider will talk with you about risks. These may include: Infection. Bleeding. Allergic reactions to anesthesia. Damage to nerves or blood vessels. Failure of the device to work. This could require another surgery to replace it. What happens before the procedure? You may have a physical exam, blood tests, and imaging tests, such as a chest X-ray. Follow instructions from your  health care provider about what you may eat and drink Ask your health care provider about: Changing or stopping your regular medicines. These include any diabetes medicines or blood thinners you take. Taking medicines such as aspirin and ibuprofen. These medicines can thin your blood. Do not take them unless your health care provider tells you to. Taking over-the-counter medicines, vitamins, herbs, and supplements. Ask your health care provider: How your surgery site will be marked. What steps will be taken to help prevent infection. These steps may include: Removing hair at the surgery site. Washing skin with a soap that kills germs. Taking antibiotics. If you will be going home right after the procedure, plan to have a responsible adult: Take you home from the hospital or clinic. You will not be allowed to drive. Do not use any products that contain nicotine or tobacco before the procedure. These products include cigarettes, chewing tobacco, and vaping devices, such as e-cigarettes. If you need help quitting, ask your health care provider What happens during the procedure? An IV will be inserted into one of your veins. You may be given: A sedative. This helps you relax Anesthesia. This will: Numb certain areas of your body. Make you fall asleep for surgery. A small incision will be made on the left side of your upper chest. A pocket will be created under your skin. The device will be placed in the pocket. The incision will be closed with stitches (sutures) or adhesive strips. A bandage (dressing) will be placed over the incision. The procedure may vary among health care providers and hospitals. What happens after the procedure? Your blood pressure, heart rate, breathing rate, and blood oxygen level will be monitored until you leave the hospital or clinic. You may be able to go home on the day of your surgery. Before you go home: Your health care provider will program your  recorder. You will learn how to trigger your device with a handheld activator. You will learn how to send recordings to your health care provider. You will get an ID card for your device, and you will be told when to use it. This information is not intended to replace advice given to you by your health care provider. Make sure you discuss any questions you have with your health care provider. Document Revised: 09/23/2021 Document Reviewed: 09/23/2021 Elsevier Patient Education  2023 [employer]  Document Revised: 10/20/2018 Document Reviewed: 10/20/2018 Elsevier Patient Education  2023 [employer]  Ambulatory Cardiac Monitoring An ambulatory cardiac monitor is a small recording device that is used  to detect abnormal heart rhythms (arrhythmias). The monitor is attached to the chest with wires by either flat, sticky disks (electrodes) or with a single small adhesive patch. You may need to wear a monitor if you have had symptoms such as: Slow, fluttering, fast, or irregular heartbeats (palpitations). Dizziness. Fainting or light-headedness. Unexplained weakness. Shortness of breath. What are the risks? Generally, these devices are safe to use. However, it is possible that the skin under the electrodes will become irritated. Supplies needed: A monitor. There are several types of monitors including: Holter monitor. This records your heart rhythm continuously, usually for 24-48 hours. Event (episodic) monitor. This monitor has a symptoms button, and when pushed, it will begin recording. You need to activate this monitor to record when you have a heart-related symptom. Automatic detection monitor. This monitor will begin recording when it detects an abnormal heartbeat. Patch recorder. This monitor is an adhesive patch that monitors your heart activity for 7-14 days. Implantable loop recorder. This monitor is a small device that is implanted under the skin and can be in place for up to 3  years. Electrodes, if needed. Medical tape. A clipper or shaver, if you have chest hair. How to prepare for monitoring Your health care provider will prepare your chest for the electrode placement and show you how to use the monitor. Make sure that you: Do not apply lotions, creams, serums, or oils to your chest before monitoring. This can cause a poor connection. Understand how to care for the monitor. Understand how to send the information from the monitor to your health care provider. You may need a phone, mobile device, or computer to send information. Understand how long you will be wearing your monitor. Know how and when to return the monitor when the testing period is complete. How to use your cardiac monitor Follow directions about how long to wear the monitor. Make sure the monitor is safely clipped to your clothing or in a location close to your body as told by your health care provider. If your monitor has a symptoms button, press the button to mark an event as soon as you feel a heart-related symptom, such as: Dizziness. Light-headedness. Palpitations. Thumping or pounding in your chest. Shortness of breath. Unexplained weakness. Keep a diary of your activities, such as walking, doing chores, and taking medicine. If your monitor has a button, it is very important to note what you were doing when you pushed the button to record your symptoms. This will help your health care provider determine what might be contributing to your symptoms. Send the recorded information from the monitor to your health care provider. It may take some time for your health care provider to process the results. Change the electrodes as told by your health care provider, or any time they stop sticking to your skin. You may need to use medical tape to keep them on. Follow these instructions at home Bathing Ask your health care provider if you may take the monitor off to shower or bathe. Do not take baths,  swim, or use a hot tub until your health care provider approves. Ask your health care provider if you may take showers. You may only be allowed to take sponge baths. If the monitor is not waterproof: Do not let it get wet. Cover it with a watertight covering when you take a bath or shower. Skin care Keep your skin clean. Try to put the electrodes in slightly different places on your chest to help  prevent skin irritation. Follow directions from your health care provider about where to place the electrodes. If you have chest hair, trim the hair where the electrodes are placed. General instructions Change the batteries in the monitor as told by your health care provider. Contact your health care provider if you have significant skin irritation from the monitor. Keep all follow-up visits. This monitor provides information to your health care provider that might change your plan of care. General recommendations Keep electronic devices away from your monitor. These include: Tablets. MP3 players. Mobile phones. While wearing your monitor, you should avoid: Electric blankets. Firefighter. Electric toothbrushes. Microwaves. Magnets. Metal detectors. Where to find more information American [employer] Association: heart.org Get help right away if you: Have chest pain. Have shortness of breath or extreme difficulty breathing. Develop a very fast heartbeat that does not get better. Develop dizziness that does not go away. Faint or constantly feel like you are about to faint. These symptoms may be an emergency. Get help right away. Call 911. Do not wait to see if the symptoms will go away. Do not drive yourself to the hospital. This information is not intended to replace advice given to you by your health care provider. Make sure you discuss any questions you have with your health care provider. Document Revised: 09/23/2021 Document Reviewed: 09/23/2021 Elsevier Patient Education  2023  [employer].
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
## 2022-03-27 ENCOUNTER — Telehealth: Payer: Self-pay | Admitting: Neurology

## 2022-03-27 NOTE — Telephone Encounter (Signed)
Pt is calling. Stated he has talked with his PCP and its okay for him to move forward with the Cerebral Angiogram.

## 2022-03-28 ENCOUNTER — Encounter: Payer: Self-pay | Admitting: Neurology

## 2022-03-31 ENCOUNTER — Other Ambulatory Visit: Payer: Self-pay | Admitting: Neurology

## 2022-03-31 DIAGNOSIS — I6509 Occlusion and stenosis of unspecified vertebral artery: Secondary | ICD-10-CM

## 2022-03-31 DIAGNOSIS — G459 Transient cerebral ischemic attack, unspecified: Secondary | ICD-10-CM

## 2022-03-31 NOTE — Telephone Encounter (Signed)
Dr. Aloha Gell scheduler confirmed with me she will call this patient.

## 2022-03-31 NOTE — Telephone Encounter (Signed)
Ordered to Walter Kennedy

## 2022-04-01 ENCOUNTER — Other Ambulatory Visit (HOSPITAL_COMMUNITY): Payer: Self-pay | Admitting: Interventional Radiology

## 2022-04-01 DIAGNOSIS — I771 Stricture of artery: Secondary | ICD-10-CM

## 2022-04-08 ENCOUNTER — Other Ambulatory Visit (HOSPITAL_COMMUNITY): Payer: Self-pay | Admitting: Interventional Radiology

## 2022-04-08 ENCOUNTER — Ambulatory Visit (HOSPITAL_COMMUNITY)
Admission: RE | Admit: 2022-04-08 | Discharge: 2022-04-08 | Disposition: A | Discharge status: Home / Self Care | Payer: Medicare Other | Source: Ambulatory Visit | Attending: Interventional Radiology | Admitting: Interventional Radiology

## 2022-04-08 DIAGNOSIS — I771 Stricture of artery: Secondary | ICD-10-CM

## 2022-04-09 HISTORY — PX: IR RADIOLOGIST EVAL & MGMT: IMG5224

## 2022-04-28 ENCOUNTER — Other Ambulatory Visit (HOSPITAL_COMMUNITY): Payer: Self-pay | Admitting: Student

## 2022-04-28 DIAGNOSIS — I771 Stricture of artery: Secondary | ICD-10-CM
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
## 2022-04-29 ENCOUNTER — Other Ambulatory Visit (HOSPITAL_COMMUNITY): Payer: Self-pay | Admitting: Interventional Radiology

## 2022-04-29 ENCOUNTER — Ambulatory Visit (HOSPITAL_COMMUNITY)
Admission: RE | Admit: 2022-04-29 | Discharge: 2022-04-29 | Disposition: A | Discharge status: Home / Self Care | Payer: Medicare Other | Source: Ambulatory Visit | Attending: Interventional Radiology | Admitting: Interventional Radiology

## 2022-04-29 ENCOUNTER — Other Ambulatory Visit: Payer: Self-pay

## 2022-04-29 DIAGNOSIS — I6521 Occlusion and stenosis of right carotid artery: Secondary | ICD-10-CM | POA: Diagnosis not present

## 2022-04-29 DIAGNOSIS — Q600 Renal agenesis, unilateral: Secondary | ICD-10-CM | POA: Diagnosis not present

## 2022-04-29 DIAGNOSIS — I771 Stricture of artery: Secondary | ICD-10-CM

## 2022-04-29 DIAGNOSIS — N411 Chronic prostatitis: Secondary | ICD-10-CM | POA: Insufficient documentation

## 2022-04-29 HISTORY — PX: IR US GUIDE VASC ACCESS RIGHT: IMG2390

## 2022-04-29 HISTORY — PX: IR ANGIO VERTEBRAL SEL VERTEBRAL BILAT MOD SED: IMG5369

## 2022-04-29 HISTORY — PX: IR ANGIO INTRA EXTRACRAN SEL COM CAROTID INNOMINATE BILAT MOD SED: IMG5360

## 2022-04-29 LAB — BASIC METABOLIC PANEL
Anion gap: 10 (ref 5–15)
BUN: 24 mg/dL — ABNORMAL HIGH (ref 8–23)
CO2: 25 mmol/L (ref 22–32)
Calcium: 9.1 mg/dL (ref 8.9–10.3)
Chloride: 106 mmol/L (ref 98–111)
Creatinine, Ser: 1.24 mg/dL (ref 0.61–1.24)
GFR, Estimated: >60 mL/min (ref >60)
Glucose, Bld: 102 mg/dL — ABNORMAL HIGH (ref 70–99)
Potassium: 4.1 mmol/L (ref 3.5–5.1)
Sodium: 141 mmol/L (ref 135–145)

## 2022-04-29 LAB — CBC
HCT: 44.8 % (ref 39.0–52.0)
Hemoglobin: 15.5 g/dL (ref 13.0–17.0)
MCH: 30.3 pg (ref 26.0–34.0)
MCHC: 34.6 g/dL (ref 30.0–36.0)
MCV: 87.5 fL (ref 80.0–100.0)
Platelets: 186 10*3/uL (ref 150–400)
RBC: 5.12 MIL/uL (ref 4.22–5.81)
RDW: 13.1 % (ref 11.5–15.5)
WBC: 3.9 10*3/uL — ABNORMAL LOW (ref 4.0–10.5)
nRBC: 0 % (ref 0.0–0.2)

## 2022-04-29 LAB — PROTIME-INR
INR: 1 (ref 0.8–1.2)
Prothrombin Time: 13 seconds (ref 11.4–15.2)

## 2022-04-29 MED ORDER — NITROGLYCERIN 1 MG/10 ML FOR IR/CATH LAB
INTRA_ARTERIAL | Status: AC
  Filled 2022-04-29: qty 10

## 2022-04-29 MED ORDER — HEPARIN SODIUM (PORCINE) 1000 UNIT/ML IJ SOLN
INTRAMUSCULAR | Status: AC
  Filled 2022-04-29: qty 10

## 2022-04-29 MED ORDER — NITROGLYCERIN 1 MG/10 ML FOR IR/CATH LAB
INTRA_ARTERIAL | Status: AC | PRN
  Administered 2022-04-29: 200 ug via INTRA_ARTERIAL
  Administered 2022-04-29: 200 ug

## 2022-04-29 MED ORDER — SODIUM CHLORIDE 0.9 % IV SOLN: INTRAVENOUS | Status: DC

## 2022-04-29 MED ORDER — FENTANYL CITRATE (PF) 100 MCG/2ML IJ SOLN
INTRAMUSCULAR | Status: AC | PRN
  Administered 2022-04-29: 25 ug via INTRAVENOUS

## 2022-04-29 MED ORDER — IOHEXOL 300 MG/ML  SOLN
100.0000 mL | Freq: Once | INTRAMUSCULAR | Status: AC | PRN
  Administered 2022-04-29: 60 mL via INTRA_ARTERIAL

## 2022-04-29 MED ORDER — VERAPAMIL HCL 2.5 MG/ML IV SOLN
INTRA_ARTERIAL | Status: AC | PRN
  Administered 2022-04-29: 7 mL via INTRA_ARTERIAL

## 2022-04-29 MED ORDER — VERAPAMIL HCL 2.5 MG/ML IV SOLN
INTRAVENOUS | Status: AC
  Filled 2022-04-29: qty 2

## 2022-04-29 MED ORDER — MIDAZOLAM HCL 2 MG/2ML IJ SOLN
INTRAMUSCULAR | Status: AC
  Filled 2022-04-29: qty 2

## 2022-04-29 MED ORDER — HYDRALAZINE HCL 20 MG/ML IJ SOLN
INTRAMUSCULAR | Status: AC
  Filled 2022-04-29: qty 1

## 2022-04-29 MED ORDER — HYDRALAZINE HCL 20 MG/ML IJ SOLN
INTRAMUSCULAR | Status: AC | PRN
  Administered 2022-04-29: 5 mg via INTRAVENOUS

## 2022-04-29 MED ORDER — MIDAZOLAM HCL 2 MG/2ML IJ SOLN
INTRAMUSCULAR | Status: AC | PRN
  Administered 2022-04-29 (×2): .5 mg via INTRAVENOUS

## 2022-04-29 MED ORDER — LIDOCAINE HCL 1 % IJ SOLN
INTRAMUSCULAR | Status: AC
  Administered 2022-04-29: 10 mL
  Filled 2022-04-29: qty 20

## 2022-04-29 MED ORDER — FENTANYL CITRATE (PF) 100 MCG/2ML IJ SOLN
INTRAMUSCULAR | Status: AC
  Filled 2022-04-29: qty 2

## 2022-04-29 MED ORDER — SODIUM CHLORIDE 0.9 % IV SOLN
INTRAVENOUS | Status: AC | PRN
  Administered 2022-04-29: 250 mL via INTRAVENOUS

## 2022-04-29 NOTE — Procedures (Signed)
INR.  Status post four-vessel cerebral arteriogram.   Right radial approach. Findings. 1.  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 2.  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 3.  No evidence of occlusions, stenosis,or  of intraluminal filling defects of the right internal carotid artery extracranially. 4.  Mild fusiform dilatation of the right internal carotid artery proximal petrous segment. Fatima Sanger MD

## 2022-06-18 ENCOUNTER — Encounter: Payer: Self-pay | Admitting: Neurology

## 2022-06-18 ENCOUNTER — Telehealth (INDEPENDENT_AMBULATORY_CARE_PROVIDER_SITE_OTHER): Payer: Medicare Other | Admitting: Neurology

## 2022-06-18 DIAGNOSIS — I679 Cerebrovascular disease, unspecified: Secondary | ICD-10-CM
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
## 2022-06-24 ENCOUNTER — Telehealth: Payer: Self-pay | Admitting: Neurology

## 2022-06-24 NOTE — Progress Notes (Signed)
Patient ID: Walter Kennedy, male   DOB: [birth year], 77 y.o.   MRN: [id number] INR.  This is an addendum to the diagnostic cath arteriogram of April 29, 2022 on patient Walter Kennedy.  Medical record [id number].  In the impression section the right common carotid arteriogram report should read as follows.  Opacification of the right internal carotid artery extracranially is noted.  Mild fusiform dilatation of the petrous right ICA, with mild stenosis of the supraclinoid right ICA is also noted.  Arlean Hopping MD.

## 2022-06-24 NOTE — Telephone Encounter (Signed)
Can you let patient know that this line in his report from Dr. Estanislado Pandy was a typo:   "Non opacification of the right internal carotid artery extra cranially."  This artery is just fine! It was a mistake, the artery is perfectly fine. I told him I would check thanks

## 2022-06-24 NOTE — Addendum Note (Signed)
Encounter addended by: Luanne Bras, MD on: 06/24/2022 4:28 PM  Actions taken: Clinical Note Signed

## 2022-06-25 NOTE — Telephone Encounter (Signed)
Spoke with patient and let him know. He was very Patent attorney.

## 2023-11-07 ENCOUNTER — Encounter (HOSPITAL_COMMUNITY): Payer: Self-pay | Admitting: Interventional Radiology

## 2023-12-11 ENCOUNTER — Other Ambulatory Visit: Payer: Self-pay | Admitting: Family Medicine

## 2023-12-11 DIAGNOSIS — R911 Solitary pulmonary nodule: Secondary | ICD-10-CM

## 2023-12-21 ENCOUNTER — Ambulatory Visit
Admission: RE | Admit: 2023-12-21 | Discharge: 2023-12-21 | Disposition: A | Discharge status: Home / Self Care | Source: Ambulatory Visit | Attending: Family Medicine | Admitting: Family Medicine

## 2023-12-21 DIAGNOSIS — R911 Solitary pulmonary nodule: Secondary | ICD-10-CM

## 2024-04-01 ENCOUNTER — Other Ambulatory Visit: Payer: Self-pay | Admitting: Urology

## 2024-04-01 DIAGNOSIS — R972 Elevated prostate specific antigen [PSA]: Secondary | ICD-10-CM

## 2024-05-01 ENCOUNTER — Other Ambulatory Visit

## 2024-05-01 DIAGNOSIS — R972 Elevated prostate specific antigen [PSA]: Secondary | ICD-10-CM

## 2024-05-01 MED ORDER — GADOPICLENOL 0.5 MMOL/ML IV SOLN
7.0000 mL | Freq: Once | INTRAVENOUS | Status: AC | PRN
  Administered 2024-05-01: 7 mL via INTRAVENOUS

## 2024-05-23 ENCOUNTER — Other Ambulatory Visit (HOSPITAL_COMMUNITY): Payer: Self-pay | Admitting: Urology

## 2024-05-23 DIAGNOSIS — C61 Malignant neoplasm of prostate: Secondary | ICD-10-CM

## 2024-06-13 ENCOUNTER — Encounter (HOSPITAL_COMMUNITY): Admission: RE | Admit: 2024-06-13 | Source: Ambulatory Visit

## 2024-06-15 ENCOUNTER — Encounter (HOSPITAL_COMMUNITY): Admission: RE | Admit: 2024-06-15 | Source: Ambulatory Visit

## 2024-06-15 DIAGNOSIS — C61 Malignant neoplasm of prostate: Secondary | ICD-10-CM

## 2024-06-15 MED ORDER — FLOTUFOLASTAT F 18 GALLIUM 296-5846 MBQ/ML IV SOLN
8.5800 | Freq: Once | INTRAVENOUS | Status: AC
  Administered 2024-06-15: 8.58 via INTRAVENOUS
